# Patient Record
Sex: Male | Born: 1970 | Race: White | Hispanic: No | Marital: Married | Smoking: Current every day smoker
Health system: Southern US, Community
[De-identification: ages and names within clinical notes are randomized; demographics above are authoritative.]

---

## 1999-07-22 ENCOUNTER — Encounter (INDEPENDENT_AMBULATORY_CARE_PROVIDER_SITE_OTHER): Payer: Self-pay | Admitting: Specialist

## 1999-07-22 ENCOUNTER — Other Ambulatory Visit: Admission: RE | Admit: 1999-07-22 | Discharge: 1999-07-22 | Payer: Self-pay | Admitting: Urology

## 1999-07-27 ENCOUNTER — Emergency Department (HOSPITAL_COMMUNITY): Admission: EM | Admit: 1999-07-27 | Discharge: 1999-07-27 | Payer: Self-pay | Admitting: Emergency Medicine

## 2001-06-23 ENCOUNTER — Encounter: Payer: Self-pay | Admitting: Family Medicine

## 2001-06-23 ENCOUNTER — Ambulatory Visit (HOSPITAL_COMMUNITY): Admission: RE | Admit: 2001-06-23 | Discharge: 2001-06-23 | Payer: Self-pay | Admitting: Family Medicine

## 2008-03-05 ENCOUNTER — Observation Stay (HOSPITAL_COMMUNITY): Admission: EM | Admit: 2008-03-05 | Discharge: 2008-03-06 | Payer: Self-pay | Admitting: Emergency Medicine

## 2008-03-05 ENCOUNTER — Ambulatory Visit: Payer: Self-pay | Admitting: Cardiology

## 2008-03-06 ENCOUNTER — Encounter (INDEPENDENT_AMBULATORY_CARE_PROVIDER_SITE_OTHER): Payer: Self-pay | Admitting: Family Medicine

## 2008-03-20 ENCOUNTER — Ambulatory Visit: Payer: Self-pay | Admitting: Cardiology

## 2008-03-20 ENCOUNTER — Encounter (HOSPITAL_COMMUNITY): Admission: RE | Admit: 2008-03-20 | Discharge: 2008-04-19 | Payer: Self-pay | Admitting: Cardiology

## 2009-12-19 IMAGING — CR DG CHEST 1V PORT
1 series · 1 of 1 positions shown · non-contrast
Comparison: None

CLINICAL DATA: Chest pain.  Hypertension.

PORTABLE CHEST - 1 VIEW

[view not recorded]
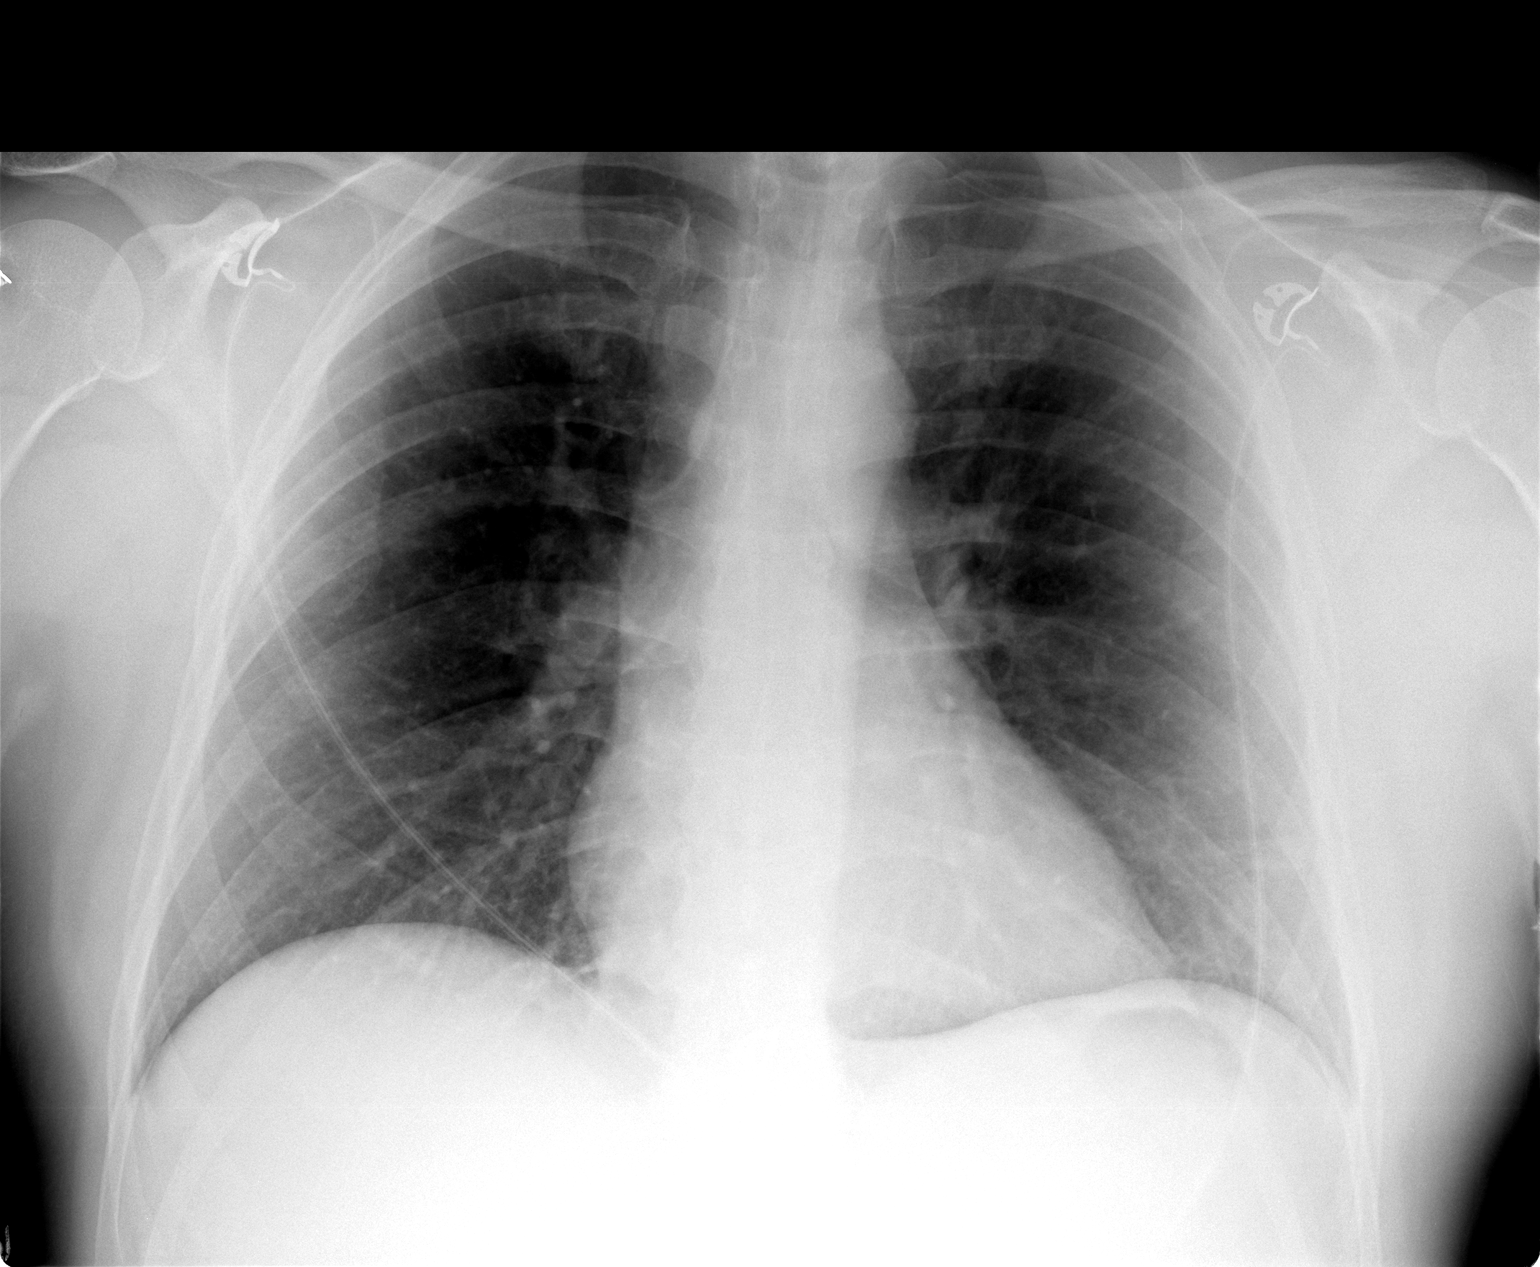

[1 of 1 positions shown; findings below may reference images not displayed]

FINDINGS: Artifact overlies chest.  Heart size is normal.  The
mediastinum is unremarkable.  Lungs are clear.  Vascularity is
normal.  No effusions.  Bony structures unremarkable.
IMPRESSION: No active disease

## 2010-04-29 LAB — POCT CARDIAC MARKERS
CKMB, poc: <1 ng/mL — ABNORMAL LOW (ref 1.0–8.0)
Myoglobin, poc: 33.2 ng/mL (ref 12–200)
Myoglobin, poc: 48.7 ng/mL (ref 12–200)

## 2010-04-29 LAB — CBC
HCT: 44.1 % (ref 39.0–52.0)
Hemoglobin: 14.8 g/dL (ref 13.0–17.0)
Hemoglobin: 15.6 g/dL (ref 13.0–17.0)
MCHC: 35 g/dL (ref 30.0–36.0)
MCHC: 35.5 g/dL (ref 30.0–36.0)
MCV: 92.5 fL (ref 78.0–100.0)
MCV: 92.7 fL (ref 78.0–100.0)
Platelets: 172 10*3/uL (ref 150–400)
RBC: 4.58 MIL/uL (ref 4.22–5.81)
RBC: 4.75 MIL/uL (ref 4.22–5.81)
RDW: 12.4 % (ref 11.5–15.5)
WBC: 5.5 10*3/uL (ref 4.0–10.5)
WBC: 7.4 10*3/uL (ref 4.0–10.5)

## 2010-04-29 LAB — BASIC METABOLIC PANEL
BUN: 13 mg/dL (ref 6–23)
CO2: 27 mEq/L (ref 19–32)
CO2: 30 mEq/L (ref 19–32)
Calcium: 9.2 mg/dL (ref 8.4–10.5)
Calcium: 9.9 mg/dL (ref 8.4–10.5)
Chloride: 105 mEq/L (ref 96–112)
Chloride: 106 mEq/L (ref 96–112)
Creatinine, Ser: 0.93 mg/dL (ref 0.4–1.5)
Creatinine, Ser: 1.01 mg/dL (ref 0.4–1.5)
GFR calc Af Amer: >60 mL/min (ref >60)
GFR calc non Af Amer: >60 mL/min (ref >60)
Glucose, Bld: 107 mg/dL — ABNORMAL HIGH (ref 70–99)
Glucose, Bld: 93 mg/dL (ref 70–99)
Potassium: 3.7 mEq/L (ref 3.5–5.1)
Sodium: 141 mEq/L (ref 135–145)

## 2010-04-29 LAB — DIFFERENTIAL
Basophils Relative: 1 % (ref 0–1)
Lymphocytes Relative: 26 % (ref 12–46)
Lymphs Abs: 1.9 10*3/uL (ref 0.7–4.0)
Lymphs Abs: 2.1 10*3/uL (ref 0.7–4.0)
Monocytes Absolute: 0.4 10*3/uL (ref 0.1–1.0)
Monocytes Relative: 5 % (ref 3–12)
Monocytes Relative: 7 % (ref 3–12)
Neutro Abs: 2.9 10*3/uL (ref 1.7–7.7)
Neutro Abs: 4.9 10*3/uL (ref 1.7–7.7)
Neutrophils Relative %: 52 % (ref 43–77)
Neutrophils Relative %: 66 % (ref 43–77)

## 2010-04-29 LAB — LIPID PANEL
Cholesterol: 184 mg/dL (ref 0–200)
HDL: 24 mg/dL — ABNORMAL LOW (ref >39)
LDL Cholesterol: 112 mg/dL — ABNORMAL HIGH (ref 0–99)
Total CHOL/HDL Ratio: 7.7 RATIO
VLDL: 48 mg/dL — ABNORMAL HIGH (ref 0–40)

## 2010-04-29 LAB — CARDIAC PANEL(CRET KIN+CKTOT+MB+TROPI)
Relative Index: INVALID (ref 0.0–2.5)
Total CK: 81 U/L (ref 7–232)

## 2010-05-27 NOTE — Consult Note (Signed)
[name], [name] NO.:  [id number]   MEDICAL RECORD NO.:  [id number]          PATIENT TYPE:  INP   LOCATION:  A308                          FACILITY:  [facility]   PHYSICIAN:  Gerrit Friends. Dietrich Pates, MD, FACCDATE OF BIRTH:  Jul 13, 1970   DATE OF CONSULTATION:  03/06/2008  DATE OF DISCHARGE:                                 CONSULTATION   PRIMARY CARE PHYSICIAN:  Dr. Hassie Bruce in [place], [place].   REASON FOR CONSULTATION:  Chest pain.   HISTORY OF PRESENT ILLNESS:  [name] is a 40 year old male patient with  a history of hypertension, who presents with chest discomfort.  He notes  a history of left-sided chest pain that he describes as pressure over  the last 6 months.  His symptoms have not been brought on by exertion,  and they are sometimes relieved by acids.  He has no associated  symptoms.  Two nights ago, he awoke with palpitations and diaphoresis.  He took his blood pressure and noted that it was elevated, and he took  an extra Coreg.  He noted some left-sided chest discomfort with this.  Yesterday, he noted chest pain off and on all day.  He tried to do push-  ups.  This did not seem to exacerbate his symptoms or make them any  better. He denies any relation to exertion.  He did feel some radiation  to his left shoulder and left neck.  He denies any associated nausea,  shortness of breath, syncope, or near syncope.  He denies orthopnea,  PND, or pedal edema.  Of note, he does have a history of palpitations.  He was apparently evaluated with an event monitor some years ago.  He  had no symptoms while wearing the monitor.  He had no arrhythmias on the  monitor.  He will occasionally get palpitations.  He feels as though his  heart rate is fast.  It will sometimes last for an hour.  He will  sometimes take a deep breath and hold it to bring relief.   PAST MEDICAL HISTORY:  As outlined above.  He also has a history of  hyperlipidemia.  He had recent lipids  checked.  No medications have been  started as of yet.   HOME MEDICATIONS:  Coreg CR 20 mg daily.   ALLERGIES:  No known drug allergies.   SOCIAL HISTORY:  Patient lives win [place] with his wife.  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  He smoked  cigarettes remotely in the past.  He does still occasionally smoke a  cigar.  He denies alcohol or drug abuse.   FAMILY HISTORY:  Significant for bypass in his father in his 53's.  He  does have a brother who had bowel surgery.   REVIEW OF SYSTEMS:  Please see HPI.  Denies fevers, chills, headache,  rash, dysuria, hematuria, bright red blood per rectum, or melena,  dysphagia, orthopnea, PND, pedal edema, syncope, near syncope, or cough.  The rest of the review of systems are negative.   PHYSICAL EXAMINATION:  He is well-developed and well-nourished male.  Blood pressure is 124/71, pulse 73, respirations 18, temperature 97.2.  Oxygen saturation 97% on room air.  HEENT:  Normal.  NECK:  Without JVD.  LYMPH:  Without lymphadenopathy.  ENDOCRINE:  Without thyromegaly.  CARDIAC:  Normal S1 and S2.  Regular rate and rhythm without murmur.  LUNGS:  Clear to auscultation bilaterally without rales, wheezes, or  rhonchi.  ABDOMEN:  Soft and nontender with normoactive bowel sounds.  No  organomegaly.  EXTREMITIES:  Without clubbing, cyanosis or edema.  MUSCULOSKELETAL:  Without joint deformity.  NEUROLOGIC:  He is alert and oriented x3.  Cranial nerves II-XII are  grossly intact.  SKIN:  Without rash.  VASCULAR:  Without carotid bruits bilaterally.   CHEST X-RAY:  No active disease.   EKG:  Sinus rhythm, heart rate of 60, normal axis, J-point elevation is  noted.   LABS:  White count 5500, hemoglobin 14.8, platelet count 152,000,  potassium 3.6, creatinine 0.93.  Cardiac enzymes negative x3.   ASSESSMENT:  1. Chest discomfort in a 40 year old male with a history of      hypertension and recently  diagnosed dyslipidemia as well as a      family history of coronary artery disease and remote cigarette use      in the past.  2. History of palpitations, suggestive of possible supraventricular      tachycardia with negative workup in the past.   RECOMMENDATIONS:  Patient was also interviewed and examined by Dr.  Dietrich Pates.  His history is of some concern for myocardial ischemia.  Risk  factors are relatively modest.  His exam, EKG, and markers are all  negative.  We will proceed with gaited exercise treadmill test today.  If this is negative, he will be ready for discharge to home.  He will be  treated empirically with proton pump inhibitor and nitroglycerin p.r.n.  Will plan followup in the office after the above testing as an  outpatient.  An echocardiogram will be obtained as well as lipids and  TSH.  He has been instructed to go to the emergency room or come to our  office in the future, should he have recurrent palpitations symptoms in  the future.   Thank you very much for the consultation.  Will be glad to follow the  patient through the remainder of this admission.      Tereso Newcomer, PA-C      Gerrit Friends. Dietrich Pates, MD, [facility]  Electronically Signed    SW/MEDQ  D:  03/06/2008  T:  03/06/2008  Job:  [id number]   cc:   Dorris Singh, DO   Hassie Bruce  Fax: [phone]

## 2010-05-27 NOTE — Procedures (Signed)
[facility]                              EXERCISE TREADMILL   NAME:[name], [name]                           MRN:          [id number]  DATE:03/06/2008                            DOB:          [birth year]    REFERRING:  [facility] - Dr. Lilian Kapur.   CLINICAL DATA:  A 40 year old gentleman with multiple cardiovascular  risk factors and atypical chest discomfort.  1. Treadmill exercise performed to a workload of 10.1 METS and a heart      rate of 175, 96% of age-predicted maximum.  Exercise discontinued      due to dyspnea and fatigue; no chest pain reported.  2. Blood pressure increased from a resting value of 120/70 to 150/90      during exercise and 160/80 in recovery, a normal response.  3. No arrhythmias noted.  4. EKG:  Normal sinus rhythm; nondiagnostic inferior Q waves;      prominent voltage.  5. Stress EKG:  During stress, up to 1.3 mm of upsloping ST-segment      depression that developed in lead II and V4-V6.  There was 2 mm of      downsloping ST-segment depression in leads III and F.  All these      changes reversed almost immediately in recovery.   IMPRESSION:  Abnormal-graded exercise test revealing adequate exercise  tolerance, no reproduction of the patient's chest discomfort, but  significant ST-segment depression in 2 continuous leads.  The absence of  symptoms and the rapid reversal of these ST-segment abnormalities  suggest that this could represent a false positive electrocardiographic  response.     Gerrit Friends. Dietrich Pates, MD, [facility]  Electronically Signed    RMR/MedQ  DD: 03/08/2008  DT: 03/09/2008  Job #: [id number]

## 2010-05-27 NOTE — H&P (Signed)
[name] NO.:  [id number]   MEDICAL RECORD NO.:  [id number]          PATIENT TYPE:  INP   LOCATION:  A308                          FACILITY:  [facility]   PHYSICIAN:  Dorris Singh, DO    DATE OF BIRTH:  12-09-1970   DATE OF ADMISSION:  03/05/2008  DATE OF DISCHARGE:  LH                              HISTORY & PHYSICAL   The patient is a 40 year old Caucasian male who presented in the [facility] emergency room with a chief complaint of chest pain.  The patient  and his wife were seen.  The patient was examined and wife was in the  room.  She recounts a history of several years ago the patient being  examined for possible SVT in the past.  He was placed on an Event  monitor in which nothing happened.  He states that he woke up with the  same kind of feeling where he felt like he had very strong palpitations.  He thought maybe it was due to his blood pressure and he took another  blood pressure medication.  He states that he woke up, nothing made the  pain better.  He felt it in his shoulder and a little bit in his neck,  and then he went to work the next day.  He was concerned about it.  He  felt a little bit of it throughout the day and then tried to elicit by  doing pushups and could not.   PAST MEDICAL HISTORY:  1. Hypertension.  2. Migraines.   FAMILY HISTORY:  Significant for valvular problems, CAD.   SOCIAL HISTORY:  The patient is married.  He is an occasional smoker,  but he is a not a drug abuser or drinker.   REVIEW OF SYSTEMS:  CONSTITUTIONAL:  Negative for fever, weight loss or  dizziness.  EYES:  Negative for eye pain or visual change.  ENT:  Negative for ear pain or hearing loss.  CARDIOVASCULAR:  Positive for  chest pain and palpitations.  RESPIRATORY:  Negative for cough or  dyspnea.  GU:  Negative for vomiting or abdominal pain.  GU:  Negative  for dysuria or hematuria.  MUSCULOSKELETAL:  Negative for arthralgias or  back pain.   SKIN:  Negative for rash or abrasions.  NEURO:  Negative for  headache.   ALLERGIES:  NO KNOWN DRUG ALLERGIES.   CURRENT MEDICATIONS:  Coreg 20 once a day.   PHYSICAL EXAMINATION:  GENERAL:  The patient is a 40 year old Caucasian  male who is well-developed and well-nourished in no acute distress.  HEENT:  Head is normocephalic, atraumatic.  Eyes PERRLA, EOMI.  Nose;  turbinates are moist.  Teeth are in good repair.  NECK:  Supple.  No lymphadenopathy.  HEART:  Regular rate and rhythm.  No murmurs.  S1-S2 present.  LUNGS:  Clear to auscultation bilaterally.  No wheezes, rales or  rhonchi.  ABDOMEN:  Soft, nontender, nondistended.  Bowel sounds in all four  quadrants.  EXTREMITIES:  Positive pulses.  No edema, ecchymosis or cyanosis.  NEURO:  Cranial  nerves II-XII are grossly intact.  Good motor and good  sensation.  SKIN:  Good turgor, good texture.  Warm.   LABORATORY DATA:  Today, chest x-ray shows no active disease.  White  count 7.4, hemoglobin 15.6, hematocrit 44.1.  Platelet count 172.  Cardiac markers were negative.  Sodium 141, potassium 3.7, chloride 105,  carbon dioxide 27, glucose 107, BUN 23.  Creatinine 1.01.  Second set of  cardiac markers are negative.  EKG was negative.   ASSESSMENT/PLAN:  Chest pain.   PLAN:  The patient on be admitted to the service of [facility].  We will  go ahead and consult [facility] since he has never been seen by  a cardiologist.  We will also obtain a 2-D echo due to the patient's  previous history of palpitations which they did not find the etiology  for.  We will keep him on his home medications and make adjustments as  necessary.  Will do DVT and GI prophylaxis and we will continue to  monitor and change management.      Dorris Singh, DO  Electronically Signed     CB/MEDQ  D:  03/06/2008  T:  03/06/2008  Job:  [id number]

## 2010-05-27 NOTE — Discharge Summary (Signed)
[name], ABE NO.:  [id number]   MEDICAL RECORD NO.:  [id number]          PATIENT TYPE:  INP   LOCATION:  A308                          FACILITY:  [facility]   PHYSICIAN:  Skeet Latch, DO    DATE OF BIRTH:  1970-09-29   DATE OF ADMISSION:  03/05/2008  DATE OF DISCHARGE:  02/23/2010LH                               DISCHARGE SUMMARY   DISCHARGE DIAGNOSES:  1. Chest pain.  2. History of palpitations suggestive of supraventricular tachycardia      with negative workup.  3. History of hyperlipidemia.   BRIEF HOSPITAL COURSE:  This is a 40 year old male with history of  hypertension and previous chest pain who presented with a chest  discomfort.  He noted that he had left-sided chest discomfort that he  describes as a pressure over the last 6 months.  The patient's symptoms  were not brought on by exertion, but sometimes were relieved by  antacids.  Two nights ago, the patient woke with palpitation,  diaphoresis, took blood pressure and noted it was elevated and he took  extra tablet of his Coreg.  One day prior, he noted on and off chest  discomfort, try to do push ups, since it did not exacerbate the  symptoms, and they make it better.  The patient did notice some  radiation to his left shoulder and left neck.  As stated, the patient  had a history of palpitation and was evaluated with a web monitor few  years back.  No events were noted.  The patient was seen, he was  admitted to the hospital.  His initial EKG with sinus rhythm, heart rate  is 60, normal axis, J-point elevation was noted.  Initial labs were  unremarkable.  He had negative cardiac enzymes.  He was admitted with a  cardiology consult.  Secondary to his concern for myocardial ischemia,  his risk factors were relatively modest and it was decided the patient  needs a stress test.  Apparently, the patient's stress test was  unremarkable.  Now, the patient can be discharged to home.   PHYSICAL  EXAMINATION:  VITAL SIGNS:  Temperature 98.3, pulse 87,  respirations 20, blood pressure 160/72, sating 96% on room air.   Lipid panel:  Cholesterol 184, triglycerides 242, HDL 24, LDL 112.  Cardiac enzymes were unremarkable.  White count 5.5, hemoglobin 14.8,  hematocrit 42.4, platelet count 152,000.  Sodium 141, potassium is 3.7,  chloride 105, CO2 27, glucose 107, BUN 13, creatinine 1.01.  Chest x-ray  showed no active disease.   DISCHARGE MEDICATIONS:  1. Coreg 20 mg daily.  2. Aspirin 81 mg daily.  3. Omeprazole 20 mg daily.  4. Nitroglycerin 0.4 mg sublingual every 5 minutes p.r.n. for chest      pain.   CONDITION AT DISCHARGE:  Stable.   DISPOSITION:  The patient will be discharged to the home.   DISCHARGE INSTRUCTIONS:  The patient to follow up with primary care  physician in the next 2-3 days.  The patient is taking all medications  as directed to use them.  Diet to be heart-healthy diet.  He is to  increase the activity to the previous.  The patient is to return to the  emergency room if he has any more chest pain, palpitations, or any  similar type symptoms and call 911 and his primary care physician.      Skeet Latch, DO  Electronically Signed     SM/MEDQ  D:  03/06/2008  T:  03/07/2008  Job:  [id number]

## 2021-02-12 ENCOUNTER — Other Ambulatory Visit: Payer: Self-pay

## 2021-02-12 ENCOUNTER — Ambulatory Visit
Admission: EM | Admit: 2021-02-12 | Discharge: 2021-02-12 | Disposition: A | Discharge status: Home / Self Care | Payer: Self-pay | Attending: Urgent Care | Admitting: Urgent Care

## 2021-02-12 DIAGNOSIS — I160 Hypertensive urgency: Secondary | ICD-10-CM

## 2021-02-12 DIAGNOSIS — I10 Essential (primary) hypertension: Secondary | ICD-10-CM

## 2021-02-12 DIAGNOSIS — Z8249 Family history of ischemic heart disease and other diseases of the circulatory system: Secondary | ICD-10-CM

## 2021-02-12 DIAGNOSIS — Z823 Family history of stroke: Secondary | ICD-10-CM

## 2021-02-12 MED ORDER — AMLODIPINE BESYLATE 5 MG PO TABS: 5.0000 mg | ORAL_TABLET | Freq: Every day | ORAL | 0 refills | Status: AC

## 2021-02-12 NOTE — ED Provider Notes (Addendum)
[name]   MRN: [id number] DOB: 1970/08/01  Subjective:   [name] is a 51 y.o. male presenting for check up on his blood pressure. Was going to a pre-employment physical exam and was found to be severely hypertensive. Has previously been on medication and was taken off when he lost a good amount of weight, lost ~40lbs and has kept it off. Denies chest pain, shob, headache, confusion, dizziness, diaphoresis, chest pressure, heart racing, palpitations n/v, abdominal pain, hematuria. Patient has had heart work up done before years ago and was very normal. History of stroke with his mother at age 40, history of CABG with his father in his 74's.  Patient does work out pretty consistently and sometimes does pretty workout supplements.  No current facility-administered medications for this encounter. No current outpatient medications on file.   No Known Allergies  History reviewed. No pertinent past medical history.   History reviewed. No pertinent surgical history.  Family History  Problem Relation Age of Onset   Stroke Mother    Heart failure Father     Social History   Tobacco Use   Smoking status: Every Day    Packs/day: 0.50    Types: Cigarettes    Passive exposure: Never   Smokeless tobacco: Never  Vaping Use   Vaping Use: Never used  Substance Use Topics   Alcohol use: Yes    Comment: Occas   Drug use: Never    ROS   Objective:   Vitals: BP (!) 200/141 (BP Location: Right Arm)    Pulse (!) 108    Temp 98.3 F (36.8 C)    Resp 18    SpO2 98%   BP Readings from Last 3 Encounters:  02/12/21 (!) 200/141   Physical Exam Constitutional:      General: He is not in acute distress.    Appearance: Normal appearance. He is well-developed. He is not ill-appearing, toxic-appearing or diaphoretic.  HENT:     Head: Normocephalic and atraumatic.     Right Ear: External ear normal.     Left Ear: External ear normal.     Nose: Nose normal.      Mouth/Throat:     Mouth: Mucous membranes are moist.  Eyes:     General: No scleral icterus.       Right eye: No discharge.        Left eye: No discharge.     Extraocular Movements: Extraocular movements intact.  Cardiovascular:     Rate and Rhythm: Normal rate and regular rhythm.     Heart sounds: Normal heart sounds. No murmur heard.   No friction rub. No gallop.  Pulmonary:     Effort: Pulmonary effort is normal. No respiratory distress.     Breath sounds: Normal breath sounds. No stridor. No wheezing, rhonchi or rales.  Skin:    General: Skin is warm and dry.  Neurological:     Mental Status: He is alert and oriented to person, place, and time.     Cranial Nerves: No cranial nerve deficit.     Motor: No weakness.     Coordination: Coordination normal.     Gait: Gait normal.     Deep Tendon Reflexes: Reflexes normal.     Comments: Negative Romberg and pronator drift. No facial asymmetry.   Psychiatric:        Mood and Affect: Mood normal.        Behavior: Behavior normal.  Thought Content: Thought content normal.        Judgment: Judgment normal.    ED ECG REPORT   Date: 02/12/2021  EKG Time: 1:43 PM  Rate: 104bpm  Rhythm: sinus tachycardia,  there are no previous tracings available for comparison  Axis: left  Intervals:none  ST&T Change: possible elevation ST in V3, T wave inversion in leads V5, V6  Narrative Interpretation: Sinus tachycardia at 104 bpm, LVH, T wave changes as above.  No previous EKG available for comparison.   Assessment and Plan :   PDMP not reviewed this encounter.  1. Hypertensive urgency   2. Essential hypertension   3. Family history of stroke   4. Family history of heart disease    Had an extensive discussion with Sier about the risks of uncontrolled HTN including ACS, MI, stroke. Advised that these are life threatening conditions but he insists that he will not go to the hospital. Offered amlodipine at 5mg  once daily with urgent  follow up here. He is not symptomatic and although he does have an abnormal ekg which I reviewed with him there are no signs of ST elevation or ST depression, signs of ACS.  He is to follow strict ER precautions. Follow up here in 3 days. Call for an appointment with a new PCP asap.    Jaynee Eagles, PA-C 02/12/21 1423   Case discussed with Dr. Lanny Cramp, my supervising physician. We were able to review the following from his previous cardiac work up:  EKG: Normal sinus rhythm; prominent voltage; nonspecific inferior  ST-T wave abnormalities.  Stress EKG:  2.5 - 2.8 mm of  flat and downsloping ST-segment  depression in the inferior leads;  1.3 - 2.5 mm in leads V5-V6,  both occurring near peak exercise.  Rapid revision of ST-segment  changes towards baseline within the first minute of recovery.  Compared to today there are some similarities with a downsloping in the inferior leads as well as in V5 V6 as seen during the stress EKG.  I was also able to review the following from the same cardiac work-up:  Scintigraphic Data: Acquisition notable for mild to moderate  diaphragmatic attenuation; left ventricular size was normal.  On  tomographic images reconstructed in standard planes, there was  uniform and normal uptake of tracer in all myocardial segments.  The rest images were unchanged.  The gated reconstruction  demonstrated normal regional and global LV systolic function as  well as normal systolic accentuation of activity throughout.  Estimated ejection fraction was 62%.   The suggested LVH today appears to be new.  However, the EKG is not convincing for true ST elevation and ST segment depression.  I will discuss need for cardiac work-up and follow-up with the patient tomorrow.  We will maintain the priority of addressing his blood pressure and establishing with a primary care provider as well as close follow-up in 3 days.  Maintain current treatment plan with amlodipine.   Jaynee Eagles,  [place] 02/12/21 1958

## 2021-02-12 NOTE — ED Triage Notes (Signed)
Patient states he went to a pre employment health service and they told him that his BP was elevated and he needs to see someone immediately to start his new job

## 2021-02-13 ENCOUNTER — Telehealth: Payer: Self-pay | Admitting: Urgent Care

## 2021-02-13 MED ORDER — ASPIRIN EC 81 MG PO TBEC: 81.0000 mg | DELAYED_RELEASE_TABLET | Freq: Every day | ORAL | 3 refills | Status: AC

## 2021-02-13 NOTE — Telephone Encounter (Signed)
Followed up with patient today. He is doing well. Denies any symptoms. Started amlodipine yesterday and has checked his blood pressure and pulse today. Readings are 160s/110s, pulse in the 70s. He is limiting strenuous activities. No alcohol, smoking or drug use. Recommended starting baby aspirin at 81mg  daily. Maintain amlodipine at 5mg  daily. I spoke with Dr. Irven Shelling office and secured an appointment for [name] 02/14/2021 at 1:30pm. He is to maintain strict ER precautions until then.

## 2021-02-14 ENCOUNTER — Ambulatory Visit: Payer: Self-pay | Admitting: Cardiology

## 2021-02-20 ENCOUNTER — Ambulatory Visit: Payer: Self-pay | Admitting: Cardiology

## 2021-02-26 ENCOUNTER — Telehealth: Payer: Self-pay | Admitting: Urgent Care

## 2021-02-27 ENCOUNTER — Ambulatory Visit: Payer: Self-pay | Admitting: Cardiology

## 2021-02-28 NOTE — Telephone Encounter (Signed)
Complete
